# Patient Record
Sex: Female | Born: 1935 | Hispanic: Yes | Marital: Single | Smoking: Never smoker
Health system: Southern US, Community
[De-identification: ages and names within clinical notes are randomized; demographics above are authoritative.]

## PROBLEM LIST (undated history)

## (undated) DIAGNOSIS — I10 Essential (primary) hypertension: Secondary | ICD-10-CM

## (undated) HISTORY — PX: EYE SURGERY: SHX253

---

## 2017-03-16 ENCOUNTER — Encounter (HOSPITAL_COMMUNITY): Payer: Self-pay

## 2017-03-16 ENCOUNTER — Emergency Department (HOSPITAL_COMMUNITY)
Admission: EM | Admit: 2017-03-16 | Discharge: 2017-03-16 | Disposition: A | Discharge status: Home / Self Care | Payer: Self-pay | Attending: Emergency Medicine | Admitting: Emergency Medicine

## 2017-03-16 ENCOUNTER — Emergency Department (HOSPITAL_COMMUNITY): Payer: Self-pay

## 2017-03-16 DIAGNOSIS — W19XXXA Unspecified fall, initial encounter: Secondary | ICD-10-CM | POA: Insufficient documentation

## 2017-03-16 DIAGNOSIS — Y939 Activity, unspecified: Secondary | ICD-10-CM | POA: Insufficient documentation

## 2017-03-16 DIAGNOSIS — Y998 Other external cause status: Secondary | ICD-10-CM | POA: Insufficient documentation

## 2017-03-16 DIAGNOSIS — I10 Essential (primary) hypertension: Secondary | ICD-10-CM | POA: Insufficient documentation

## 2017-03-16 DIAGNOSIS — S32010A Wedge compression fracture of first lumbar vertebra, initial encounter for closed fracture: Secondary | ICD-10-CM | POA: Insufficient documentation

## 2017-03-16 DIAGNOSIS — Y929 Unspecified place or not applicable: Secondary | ICD-10-CM | POA: Insufficient documentation

## 2017-03-16 HISTORY — DX: Essential (primary) hypertension: I10

## 2017-03-16 MED ORDER — HYDROCODONE-ACETAMINOPHEN 5-325 MG PO TABS
0.5000 | ORAL_TABLET | Freq: Once | ORAL | Status: AC
  Administered 2017-03-16: 0.5 via ORAL
  Filled 2017-03-16: qty 1

## 2017-03-16 MED ORDER — DOCUSATE SODIUM 100 MG PO CAPS: 100.0000 mg | ORAL_CAPSULE | Freq: Two times a day (BID) | ORAL | 0 refills | Status: AC

## 2017-03-16 MED ORDER — HYDROCODONE-ACETAMINOPHEN 5-325 MG PO TABS: 0.5000 | ORAL_TABLET | Freq: Four times a day (QID) | ORAL | 0 refills | Status: AC | PRN

## 2017-03-16 MED ORDER — IBUPROFEN 400 MG PO TABS
600.0000 mg | ORAL_TABLET | Freq: Once | ORAL | Status: AC
  Administered 2017-03-16: 21:00:00 600 mg via ORAL
  Filled 2017-03-16: qty 1

## 2017-03-16 MED ORDER — IBUPROFEN 400 MG PO TABS: 400.0000 mg | ORAL_TABLET | Freq: Four times a day (QID) | ORAL | 0 refills | Status: AC

## 2017-03-16 MED ORDER — DOCUSATE SODIUM 100 MG PO CAPS
100.0000 mg | ORAL_CAPSULE | Freq: Once | ORAL | Status: AC
  Administered 2017-03-16: 100 mg via ORAL
  Filled 2017-03-16: qty 1

## 2017-03-16 MED ORDER — ACETAMINOPHEN 325 MG PO TABS: 650.0000 mg | ORAL_TABLET | Freq: Four times a day (QID) | ORAL | 0 refills | Status: AC

## 2017-03-16 MED ORDER — ACETAMINOPHEN 325 MG PO TABS
650.0000 mg | ORAL_TABLET | Freq: Once | ORAL | Status: AC
  Administered 2017-03-16: 650 mg via ORAL
  Filled 2017-03-16: qty 2

## 2017-03-16 NOTE — ED Notes (Signed)
Patient transported to X-ray 

## 2017-03-16 NOTE — ED Triage Notes (Signed)
Pt reports lower back pain onset 10 weeks ago. She denies any new urinary problems. She was in [place], came here to [place] Tuesday. Pt reports she fell off a hammock last week.

## 2017-03-16 NOTE — ED Notes (Signed)
Pt ambulatory, steady gait.

## 2017-03-16 NOTE — ED Provider Notes (Signed)
[facility]-EMERGENCY DEPT Provider Note   CSN: [id number] Arrival date & time: 03/16/17  1302     History   Chief Complaint Chief Complaint  Patient presents with  . Back Pain    HPI Frances Fernandez is a 83 y.o. female.   Fall  This is a new problem. The current episode started more than 1 week ago. The problem has not changed since onset.Pertinent negatives include no chest pain, no abdominal pain, no headaches and no shortness of breath. Associated symptoms comments: Lower middle back pain. The symptoms are aggravated by bending and walking. The symptoms are relieved by rest. She has tried rest for the symptoms. The treatment provided no relief.    Past Medical History:  Diagnosis Date  . Hypertension     There are no active problems to display for this patient.   Past Surgical History:  Procedure Laterality Date  . EYE SURGERY      OB History    No data available       Home Medications    Prior to Admission medications   Medication Sig Start Date End Date Taking? Authorizing Provider  acetaminophen (TYLENOL) 325 MG tablet Take 2 tablets (650 mg total) by mouth every 6 (six) hours. 03/16/17   Cherylann Hobday, Barbara Cower, MD  docusate sodium (COLACE) 100 MG capsule Take 1 capsule (100 mg total) by mouth every 12 (twelve) hours. 03/16/17   Zephaniah Lubrano, Barbara Cower, MD  HYDROcodone-acetaminophen (NORCO/VICODIN) 5-325 MG tablet Take 0.5-1 tablets by mouth every 6 (six) hours as needed for severe pain. 03/16/17   Raiven Belizaire, Barbara Cower, MD  ibuprofen (ADVIL,MOTRIN) 400 MG tablet Take 1 tablet (400 mg total) by mouth 4 (four) times daily. 03/16/17   Laurissa Cowper, Barbara Cower, MD    Family History No family history on file.  Social History Social History  Substance Use Topics  . Smoking status: Never Smoker  . Smokeless tobacco: Never Used  . Alcohol use No     Allergies   Patient has no allergy information on record.   Review of Systems Review of Systems  Respiratory: Negative for shortness of  breath.   Cardiovascular: Negative for chest pain.  Gastrointestinal: Negative for abdominal pain.  Neurological: Negative for headaches.  All other systems reviewed and are negative.    Physical Exam Updated Vital Signs BP (!) 181/72 (BP Location: Left Arm)   Pulse 85   Temp 98.1 F (36.7 C) (Oral)   Resp 17   SpO2 99%   Physical Exam  Constitutional: She is oriented to person, place, and time. She appears well-developed and well-nourished.  HENT:  Head: Normocephalic and atraumatic.  Eyes: Conjunctivae and EOM are normal.  Neck: Normal range of motion.  Cardiovascular: Normal rate and regular rhythm.   Pulmonary/Chest: Effort normal and breath sounds normal. No stridor. No respiratory distress.  Abdominal: Soft. She exhibits no distension.  Musculoskeletal: She exhibits tenderness (lower thoracic/upper lumbar spine). She exhibits no edema or deformity.  Neurological: She is alert and oriented to person, place, and time. No cranial nerve deficit.  Skin: Skin is warm and dry. No erythema. No pallor.  Nursing note and vitals reviewed.    ED Treatments / Results  Labs (all labs ordered are listed, but only abnormal results are displayed) Labs Reviewed - No data to display  EKG  EKG Interpretation None       Radiology Dg Lumbar Spine Complete  Result Date: 03/16/2017 CLINICAL DATA:  Larey Seat off the hammock 10 days ago. Pain in the right  side. EXAM: LUMBAR SPINE - COMPLETE 4+ VIEW COMPARISON:  None. FINDINGS: There is a wedge compression fracture of L1 with approximately 30% loss of anterior vertebral body height. No definite retropulsion identified on plain films. Fracture is favored to be acute with could be chronic. There is no spondylolisthesis. Bones appear osteopenic. No suspicious lytic or blastic lesions are identified. There is facet hypertrophy in the lower lumbar levels. Visualized bowel gas pattern is nonobstructive. IMPRESSION: 1. Wedge compression fracture of  L1, associated with 30% loss of anterior height, favored to be acute. 2. Degenerative changes. 3. Suspect osteoporosis/osteopenia. Electronically Signed   By: Norva PavlovElizabeth  Brown M.D.   On: 03/16/2017 18:41   Dg Pelvis 1-2 Views  Result Date: 03/16/2017 CLINICAL DATA:  Patient fell off a hammock 10 days ago. Pain in the lower right side. EXAM: PELVIS - 1-2 VIEW COMPARISON:  None. FINDINGS: There is no evidence of pelvic fracture or diastasis. No pelvic bone lesions are seen. IMPRESSION: Negative. Electronically Signed   By: Norva PavlovElizabeth  Brown M.D.   On: 03/16/2017 18:37    Procedures Procedures (including critical care time)  Medications Ordered in ED Medications  acetaminophen (TYLENOL) tablet 650 mg (650 mg Oral Given 03/16/17 1946)  ibuprofen (ADVIL,MOTRIN) tablet 600 mg (600 mg Oral Given 03/16/17 2049)  docusate sodium (COLACE) capsule 100 mg (100 mg Oral Given 03/16/17 2146)  HYDROcodone-acetaminophen (NORCO/VICODIN) 5-325 MG per tablet 0.5 tablet (0.5 tablets Oral Given 03/16/17 2146)     Initial Impression / Assessment and Plan / ED Course  I have reviewed the triage vital signs and the nursing notes.  Pertinent labs & imaging results that were available during my care of the patient were reviewed by me and considered in my medical decision making (see chart for details).     Acute wedge compression fracture of spine, pain controlled. Neuro intact in lower extremities, walks slow but without difficulty. Stable for dc w/ pain control and nsg follow up but is from [place] so may follow up down there.   Final Clinical Impressions(s) / ED Diagnoses   Final diagnoses:  Compression fracture of L1 lumbar vertebra, closed, initial encounter [facility](HCC)    New Prescriptions Discharge Medication List as of 03/16/2017  9:34 PM    START taking these medications   Details  acetaminophen (TYLENOL) 325 MG tablet Take 2 tablets (650 mg total) by mouth every 6 (six) hours., Starting Fri 03/16/2017,  Print    docusate sodium (COLACE) 100 MG capsule Take 1 capsule (100 mg total) by mouth every 12 (twelve) hours., Starting Fri 03/16/2017, Print    HYDROcodone-acetaminophen (NORCO/VICODIN) 5-325 MG tablet Take 0.5-1 tablets by mouth every 6 (six) hours as needed for severe pain., Starting Fri 03/16/2017, Print    ibuprofen (ADVIL,MOTRIN) 400 MG tablet Take 1 tablet (400 mg total) by mouth 4 (four) times daily., Starting Fri 03/16/2017, Print         Keoshia Steinmetz, Barbara CowerJason, MD 03/17/17 [id number]

## 2018-04-10 IMAGING — DX DG LUMBAR SPINE COMPLETE 4+V
5 series · 5 of 5 positions shown · non-contrast
Comparison: None.

CLINICAL DATA: Fell off the [place] 10 days ago. Pain in the right
side.

EXAM:
LUMBAR SPINE - COMPLETE 4+ VIEW

[l-spine ap]
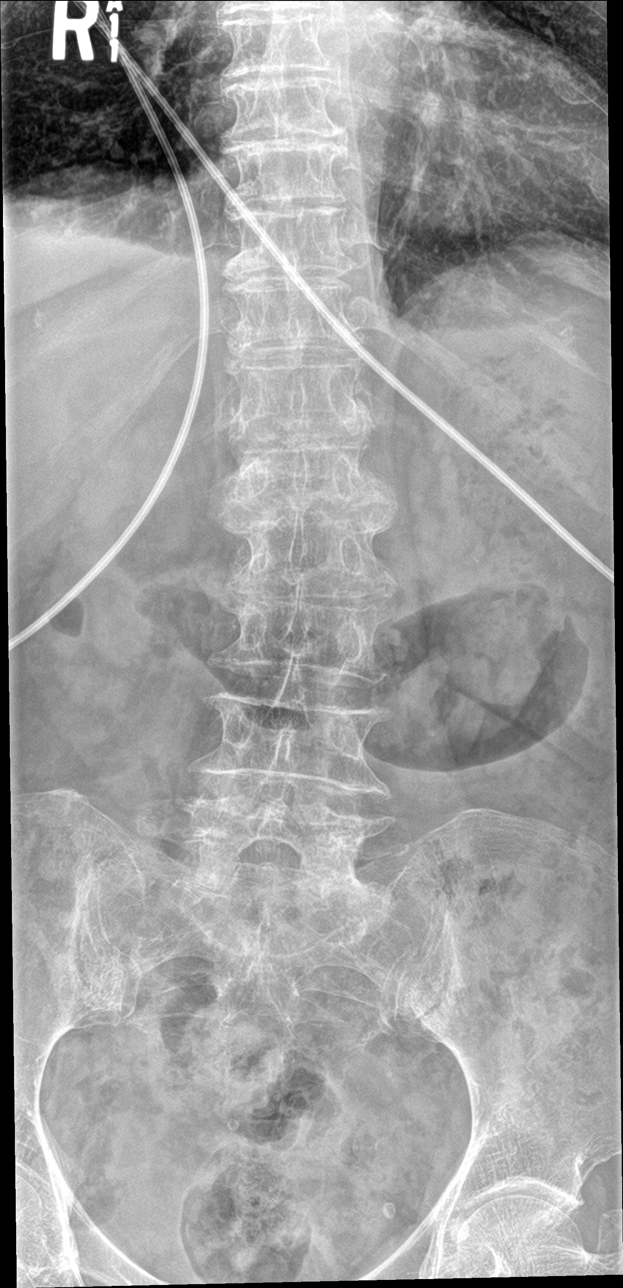

[l-spine obl (1 of 2)]
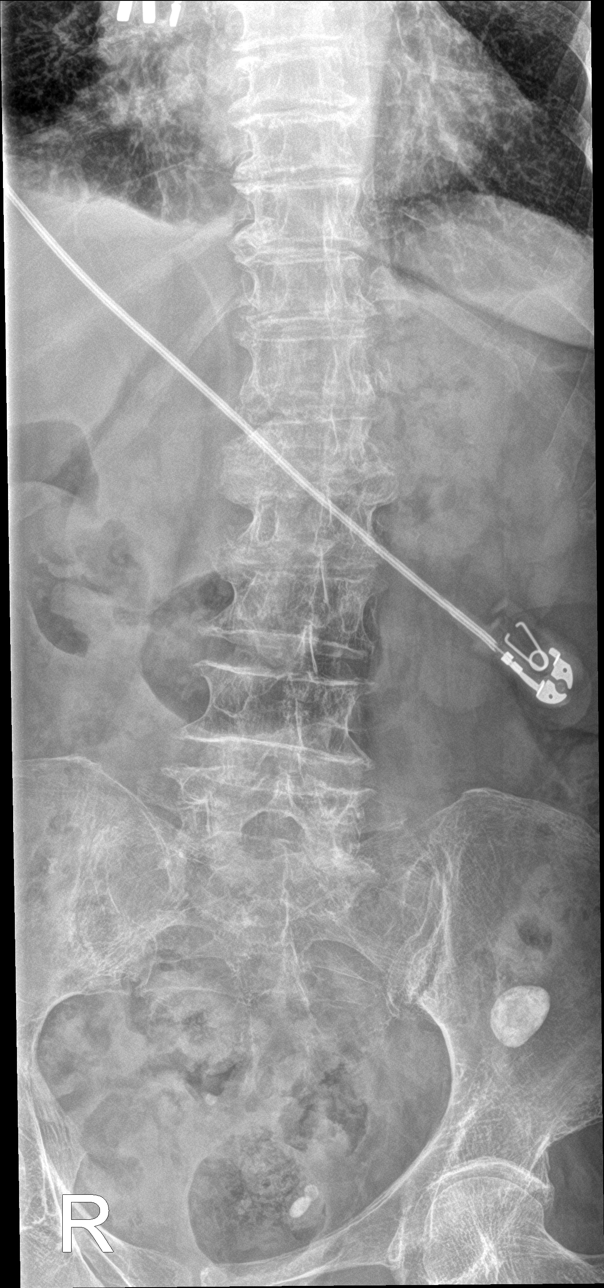

[l-spine obl (2 of 2)]
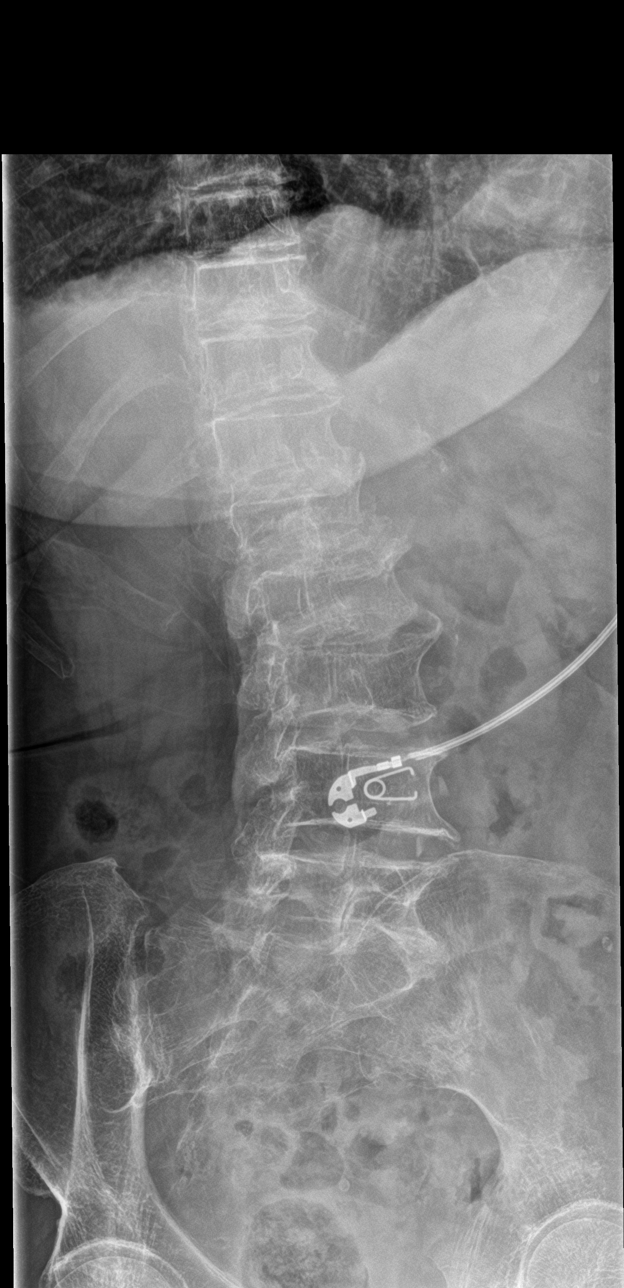

[l-spine lat]
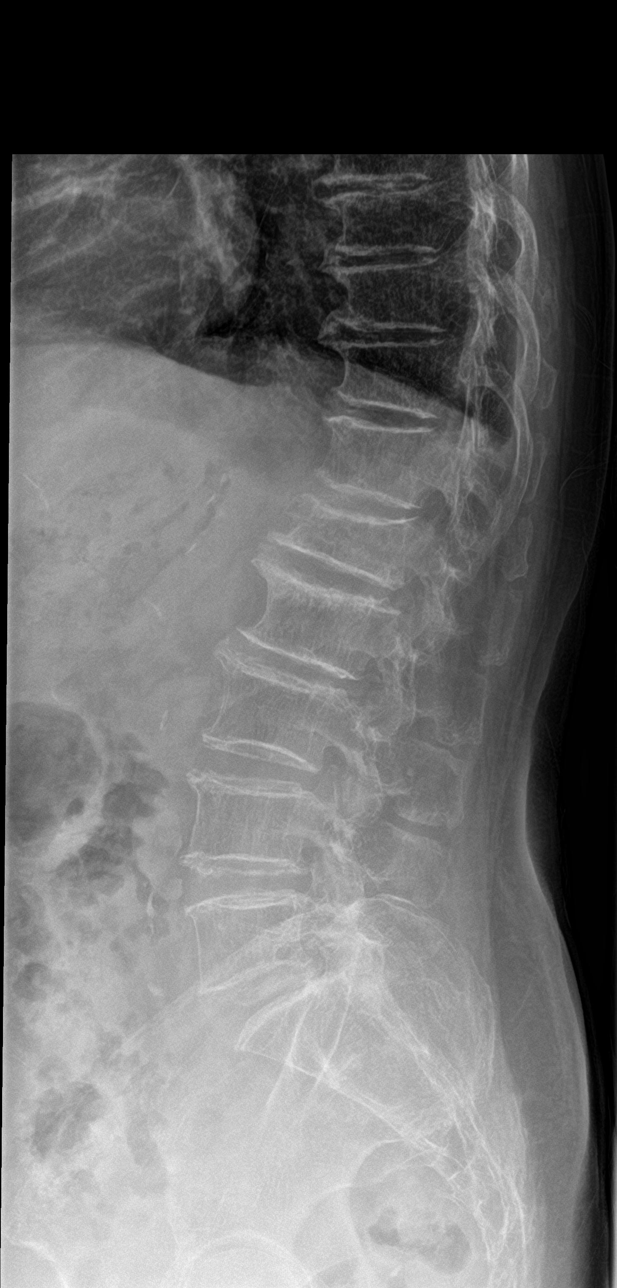

[l-spine spot]
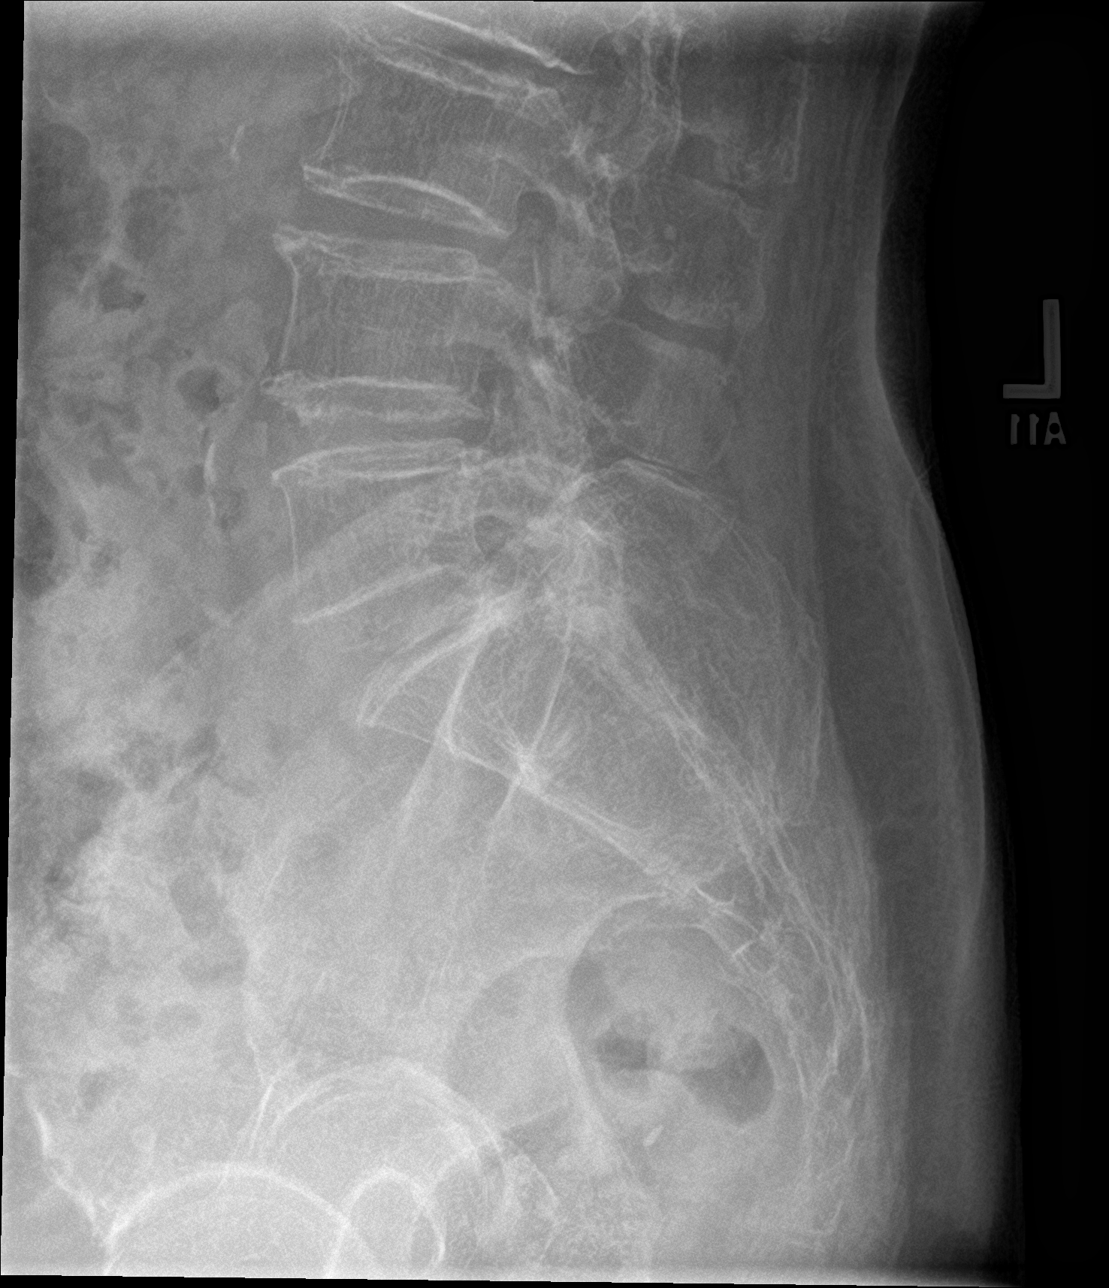

[5 of 5 positions shown; findings below may reference images not displayed]

FINDINGS: There is a wedge compression fracture of L1 with approximately 30%
loss of anterior vertebral body height. No definite retropulsion
identified on plain films. Fracture is favored to be acute with
could be chronic. There is no spondylolisthesis. Bones appear
osteopenic. No suspicious lytic or blastic lesions are identified.
There is facet hypertrophy in the lower lumbar levels. Visualized
bowel gas pattern is nonobstructive.
IMPRESSION: 1. Wedge compression fracture of L1, associated with 30% loss of
anterior height, favored to be acute.
2. Degenerative changes.
3. Suspect osteoporosis/osteopenia.

## 2021-01-30 DEATH — deceased
